# Patient Record
Sex: Male | Born: 1990 | Race: Black or African American | Hispanic: No | Marital: Married | Smoking: Current every day smoker
Health system: Southern US, Community
[De-identification: ages and names within clinical notes are randomized; demographics above are authoritative.]

## PROBLEM LIST (undated history)

## (undated) DIAGNOSIS — L0291 Cutaneous abscess, unspecified: Secondary | ICD-10-CM

## (undated) DIAGNOSIS — J40 Bronchitis, not specified as acute or chronic: Secondary | ICD-10-CM

---
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
## 2010-09-27 ENCOUNTER — Emergency Department (HOSPITAL_BASED_OUTPATIENT_CLINIC_OR_DEPARTMENT_OTHER)
Admission: EM | Admit: 2010-09-27 | Discharge: 2010-09-27 | Disposition: A | Discharge status: Home / Self Care | Payer: Self-pay | Attending: Emergency Medicine | Admitting: Emergency Medicine

## 2010-09-27 DIAGNOSIS — B009 Herpesviral infection, unspecified: Secondary | ICD-10-CM | POA: Insufficient documentation

## 2010-09-27 DIAGNOSIS — F17200 Nicotine dependence, unspecified, uncomplicated: Secondary | ICD-10-CM | POA: Insufficient documentation

## 2010-12-17 ENCOUNTER — Emergency Department (HOSPITAL_BASED_OUTPATIENT_CLINIC_OR_DEPARTMENT_OTHER)
Admission: EM | Admit: 2010-12-17 | Discharge: 2010-12-18 | Disposition: A | Discharge status: Home / Self Care | Payer: Self-pay | Attending: Emergency Medicine | Admitting: Emergency Medicine

## 2010-12-17 DIAGNOSIS — L03317 Cellulitis of buttock: Secondary | ICD-10-CM | POA: Insufficient documentation

## 2010-12-17 DIAGNOSIS — L0231 Cutaneous abscess of buttock: Secondary | ICD-10-CM | POA: Insufficient documentation

## 2011-10-12 ENCOUNTER — Emergency Department (HOSPITAL_BASED_OUTPATIENT_CLINIC_OR_DEPARTMENT_OTHER)
Admission: EM | Admit: 2011-10-12 | Discharge: 2011-10-12 | Disposition: A | Discharge status: Home / Self Care | Payer: Self-pay | Attending: Emergency Medicine | Admitting: Emergency Medicine

## 2011-10-12 ENCOUNTER — Encounter (HOSPITAL_BASED_OUTPATIENT_CLINIC_OR_DEPARTMENT_OTHER): Payer: Self-pay | Admitting: Emergency Medicine

## 2011-10-12 DIAGNOSIS — L0231 Cutaneous abscess of buttock: Secondary | ICD-10-CM

## 2011-10-12 HISTORY — DX: Cutaneous abscess, unspecified: L02.91

## 2011-10-12 MED ORDER — OXYCODONE-ACETAMINOPHEN 5-325 MG PO TABS: ORAL_TABLET | ORAL | Status: AC

## 2011-10-12 MED ORDER — LIDOCAINE HCL 2 % IJ SOLN
INTRAMUSCULAR | Status: AC
  Administered 2011-10-12: 16:00:00
  Filled 2011-10-12: qty 1

## 2011-10-12 MED ORDER — CLINDAMYCIN HCL 300 MG PO CAPS: 300.0000 mg | ORAL_CAPSULE | Freq: Three times a day (TID) | ORAL | Status: AC

## 2011-10-12 MED ORDER — SULFAMETHOXAZOLE-TRIMETHOPRIM 800-160 MG PO TABS: 1.0000 | ORAL_TABLET | Freq: Two times a day (BID) | ORAL | Status: AC

## 2011-10-12 NOTE — ED Notes (Signed)
Pt c/o recurrent abscess on LT buttock

## 2011-10-12 NOTE — Discharge Instructions (Signed)
Abscess An abscess (boil or furuncle) is an infected area that contains a collection of pus.  SYMPTOMS Signs and symptoms of an abscess include pain, tenderness, redness, or hardness. You may feel a moveable soft area under your skin. An abscess can occur anywhere in the body.  TREATMENT  A surgical cut (incision) may be made over your abscess to drain the pus. Gauze may be packed into the space or a drain may be looped through the abscess cavity (pocket). This provides a drain that will allow the cavity to heal from the inside outwards. The abscess may be painful for a few days, but should feel much better if it was drained.  Your abscess, if seen early, may not have localized and may not have been drained. If not, another appointment may be required if it does not get better on its own or with medications. HOME CARE INSTRUCTIONS   Only take over-the-counter or prescription medicines for pain, discomfort, or fever as directed by your caregiver.   Take your antibiotics as directed if they were prescribed. Finish them even if you start to feel better.   Keep the skin and clothes clean around your abscess.   If the abscess was drained, you will need to use gauze dressing to collect any draining pus. Dressings will typically need to be changed 3 or more times a day.   The infection may spread by skin contact with others. Avoid skin contact as much as possible.   Practice good hygiene. This includes regular hand washing, cover any draining skin lesions, and do not share personal care items.   If you participate in sports, do not share athletic equipment, towels, whirlpools, or personal care items. Shower after every practice or tournament.   If a draining area cannot be adequately covered:   Do not participate in sports.   Children should not participate in day care until the wound has healed or drainage stops.   If your caregiver has given you a follow-up appointment, it is very important  to keep that appointment. Not keeping the appointment could result in a much worse infection, chronic or permanent injury, pain, and disability. If there is any problem keeping the appointment, you must call back to this facility for assistance.  SEEK MEDICAL CARE IF:   You develop increased pain, swelling, redness, drainage, or bleeding in the wound site.   You develop signs of generalized infection including muscle aches, chills, fever, or a general ill feeling.   You have an oral temperature above 102 F (38.9 C).  MAKE SURE YOU:   Understand these instructions.   Will watch your condition.   Will get help right away if you are not doing well or get worse.  Document Released: 03/10/2005 Document Revised: 05/20/2011 Document Reviewed: 01/02/2008 [employer] Patient Information 2012 [employer].      Narcotic and benzodiazepine use may cause drowsiness, slowed breathing or dependence.  Please use with caution and do not drive, operate machinery or watch young children alone while taking them.  Taking combinations of these medications or drinking alcohol will potentiate these effects.    

## 2011-10-12 NOTE — ED Provider Notes (Signed)
History     CSN: [id number]  Arrival date & time 10/12/11  1449   First MD Initiated Contact with Patient 10/12/11 1517      Chief Complaint  Patient presents with  . Recurrent Skin Infections    (Consider location/radiation/quality/duration/timing/severity/associated sxs/prior treatment) HPI Comments: Pt reports prior h/o multiple abscesses.  He reports 2 lesions to left buttock, one came up a few days ago, one just yesterday.  No fevers.  He tried putting topical medication on it and one drained a small amount.    The history is provided by the patient.    Past Medical History  Diagnosis Date  . Abscess     History reviewed. No pertinent past surgical history.  History reviewed. No pertinent family history.  History  Substance Use Topics  . Smoking status: Current Everyday Smoker  . Smokeless tobacco: Not on file  . Alcohol Use: No      Review of Systems  Constitutional: Negative for fever and chills.  Gastrointestinal: Negative for blood in stool and rectal pain.  Musculoskeletal: Negative for myalgias.  Skin: Positive for wound.    Allergies  Hydrocodone  Home Medications   Current Outpatient Rx  Name Route Sig Dispense Refill  . CLINDAMYCIN HCL 300 MG PO CAPS Oral Take 1 capsule (300 mg total) by mouth 3 (three) times daily. 30 capsule 0  . OXYCODONE-ACETAMINOPHEN 5-325 MG PO TABS  1-2 tablets po q 6 hours prn moderate to severe pain 20 tablet 0  . SULFAMETHOXAZOLE-TRIMETHOPRIM 800-160 MG PO TABS Oral Take 1 tablet by mouth 2 (two) times daily. 28 tablet 0    BP 145/77  Pulse 87  Temp(Src) 98.5 F (36.9 C) (Oral)  Resp 16  Ht 5\' 11"  (1.803 m)  Wt 227 lb (102.967 kg)  BMI 31.66 kg/m2  SpO2 100%  Physical Exam  Nursing note and vitals reviewed. Constitutional: He appears well-developed and well-nourished.  HENT:  Head: Normocephalic.  Abdominal: Soft.  Neurological: He is alert.  Skin: Skin is warm, dry and intact. He is not diaphoretic.        ED Course  INCISION AND DRAINAGE Date/Time: 10/12/2011 3:35 PM Performed by: Lear Ng. Authorized by: Lear Ng Consent: Verbal consent obtained. Risks and benefits: risks, benefits and alternatives were discussed Consent given by: patient Patient understanding: patient states understanding of the procedure being performed Patient identity confirmed: verbally with patient Time out: Immediately prior to procedure a "time out" was called to verify the correct patient, procedure, equipment, support staff and site/side marked as required. Type: abscess Body area: lower extremity Location details: left buttock Anesthesia: local infiltration Local anesthetic: lidocaine 2% without epinephrine Anesthetic total: 1 ml Patient sedated: no Scalpel size: 11 Incision type: single straight Complexity: complex Drainage: purulent and bloody Drainage amount: moderate Wound treatment: wound left open Packing material: 1/4 in iodoform gauze Patient tolerance: Patient tolerated the procedure well with no immediate complications. Comments: Induration still present underneath.    INCISION AND DRAINAGE Date/Time: 10/12/2011 4:06 PM Performed by: Lear Ng. Authorized by: Lear Ng Consent: Verbal consent obtained. Risks and benefits: risks, benefits and alternatives were discussed Consent given by: patient Patient understanding: patient states understanding of the procedure being performed Patient identity confirmed: verbally with patient Time out: Immediately prior to procedure a "time out" was called to verify the correct patient, procedure, equipment, support staff and site/side marked as required. Type: abscess Body area: lower extremity Location details: left buttock Anesthesia: local infiltration Local  anesthetic: lidocaine 2% without epinephrine Anesthetic total: 1 ml Patient sedated: no Scalpel size: 11 Incision type: single straight Complexity:  simple Drainage: serosanguinous Drainage amount: scant Wound treatment: wound left open Patient tolerance: Patient tolerated the procedure well with no immediate complications.   (including critical care time)  Labs Reviewed - No data to display No results found.   1. Abscess of buttock, left   2. Abscess of left buttock       MDM  2 abscesses, requiring I&D.  Pt drove self.  Will give Rx for pain and abx to cover for MRSA as well.  Can return for wound recheck in 2 days as here or with PCP due to continued induration for superior abscess and no sig pus drainage from second one.        Gavin Pound. Mariadelcarmen Corella, MD 10/12/11 1609

## 2011-10-12 NOTE — ED Notes (Signed)
Wound care provided by Marijean Niemann, EMT

## 2011-11-30 ENCOUNTER — Emergency Department (HOSPITAL_BASED_OUTPATIENT_CLINIC_OR_DEPARTMENT_OTHER)
Admission: EM | Admit: 2011-11-30 | Discharge: 2011-11-30 | Disposition: A | Discharge status: Home / Self Care | Payer: Self-pay | Attending: Emergency Medicine | Admitting: Emergency Medicine

## 2011-11-30 ENCOUNTER — Encounter (HOSPITAL_BASED_OUTPATIENT_CLINIC_OR_DEPARTMENT_OTHER): Payer: Self-pay | Admitting: *Deleted

## 2011-11-30 DIAGNOSIS — J40 Bronchitis, not specified as acute or chronic: Secondary | ICD-10-CM | POA: Insufficient documentation

## 2011-11-30 DIAGNOSIS — F17200 Nicotine dependence, unspecified, uncomplicated: Secondary | ICD-10-CM | POA: Insufficient documentation

## 2011-11-30 MED ORDER — AZITHROMYCIN 250 MG PO TABS: 500.0000 mg | ORAL_TABLET | Freq: Once | ORAL | Status: DC

## 2011-11-30 MED ORDER — AZITHROMYCIN 250 MG PO TABS: 250.0000 mg | ORAL_TABLET | Freq: Every day | ORAL | Status: AC

## 2011-11-30 NOTE — Discharge Instructions (Signed)
Bronchitis Bronchitis is a problem of the air tubes leading to your lungs. This problem makes it hard for air to get in and out of the lungs. You may cough a lot because your air tubes are narrow. Going without care can cause lasting (chronic) bronchitis. HOME CARE   Drink enough fluids to keep your pee (urine) clear or pale yellow.   Use a cool mist humidifier.   Quit smoking if you smoke. If you keep smoking, the bronchitis might not get better.   Only take medicine as told by your doctor.  GET HELP RIGHT AWAY IF:   Coughing keeps you awake.   You start to wheeze.   You become more sick or weak.   You have a hard time breathing or get short of breath.   You cough up blood.   Coughing lasts more than 2 weeks.   You have a fever.   Your baby is older than 3 months with a rectal temperature of 102 F (38.9 C) or higher.   Your baby is 3 months old or younger with a rectal temperature of 100.4 F (38 C) or higher.  MAKE SURE YOU:  Understand these instructions.   Will watch your condition.   Will get help right away if you are not doing well or get worse.  Document Released: 11/17/2007 Document Revised: 05/20/2011 Document Reviewed: 05/02/2009 [employer] Patient Information 2012 [employer]. 

## 2011-11-30 NOTE — ED Provider Notes (Signed)
History     CSN: [id number]  Arrival date & time 11/30/11  1353   First MD Initiated Contact with Patient 11/30/11 1429      Chief Complaint  Patient presents with  . URI    (Consider location/radiation/quality/duration/timing/severity/associated sxs/prior treatment) HPI 24 hours of cough, with productive sputum.  Hx of smoking.  No wheezing.  Denies fever, chill, n/v.   Past Medical History  Diagnosis Date  . Abscess     History reviewed. No pertinent past surgical history.  History reviewed. No pertinent family history.  History  Substance Use Topics  . Smoking status: Current Everyday Smoker  . Smokeless tobacco: Not on file  . Alcohol Use: No      Review of Systems  All other systems reviewed and are negative.    Allergies  Hydrocodone  Home Medications   Current Outpatient Rx  Name Route Sig Dispense Refill  . AZITHROMYCIN 250 MG PO TABS Oral Take 1 tablet (250 mg total) by mouth daily. Take first 2 tablets together, then 1 every day until finished. 6 tablet 0    BP 118/89  Pulse 72  Temp 98.1 F (36.7 C) (Oral)  Resp 18  Ht 5\' 11"  (1.803 m)  Wt 215 lb (97.523 kg)  BMI 29.99 kg/m2  SpO2 100%  Physical Exam  Nursing note and vitals reviewed. Constitutional: He is oriented to person, place, and time. He appears well-developed and well-nourished. No distress.  HENT:  Head: Normocephalic and atraumatic.  Eyes: Pupils are equal, round, and reactive to light.  Neck: Normal range of motion.  Cardiovascular: Normal rate and intact distal pulses.   Pulmonary/Chest: Effort normal. No respiratory distress. He has no wheezes.  Abdominal: Normal appearance. He exhibits no distension. There is no tenderness. There is no rebound.  Musculoskeletal: Normal range of motion.  Neurological: He is alert and oriented to person, place, and time. No cranial nerve deficit.  Skin: Skin is warm and dry. No rash noted.  Psychiatric: He has a normal mood and affect.  His behavior is normal.    ED Course  Procedures (including critical care time)  Labs Reviewed - No data to display No results found.   1. Bronchitis       MDM         Nelia Shi, MD 11/30/11 [id number]

## 2011-11-30 NOTE — ED Notes (Signed)
Pt c/o of cough cold type symptoms since last nite. Pt in NAD. States has a 23 year old daughter and wants to make sure he wont make her sick"

## 2011-11-30 NOTE — ED Notes (Signed)
Pt c/o uri symptoms x 2 days 

## 2011-12-16 ENCOUNTER — Encounter (HOSPITAL_BASED_OUTPATIENT_CLINIC_OR_DEPARTMENT_OTHER): Payer: Self-pay | Admitting: *Deleted

## 2011-12-16 ENCOUNTER — Emergency Department (HOSPITAL_BASED_OUTPATIENT_CLINIC_OR_DEPARTMENT_OTHER): Payer: Self-pay

## 2011-12-16 ENCOUNTER — Emergency Department (HOSPITAL_BASED_OUTPATIENT_CLINIC_OR_DEPARTMENT_OTHER)
Admission: EM | Admit: 2011-12-16 | Discharge: 2011-12-16 | Disposition: A | Discharge status: Home / Self Care | Payer: Self-pay | Attending: Emergency Medicine | Admitting: Emergency Medicine

## 2011-12-16 DIAGNOSIS — X500XXA Overexertion from strenuous movement or load, initial encounter: Secondary | ICD-10-CM | POA: Insufficient documentation

## 2011-12-16 DIAGNOSIS — S92109A Unspecified fracture of unspecified talus, initial encounter for closed fracture: Secondary | ICD-10-CM | POA: Insufficient documentation

## 2011-12-16 HISTORY — DX: Bronchitis, not specified as acute or chronic: J40

## 2011-12-16 MED ORDER — OXYCODONE-ACETAMINOPHEN 5-325 MG PO TABS: 1.0000 | ORAL_TABLET | ORAL | Status: AC | PRN

## 2011-12-16 MED ORDER — IBUPROFEN 800 MG PO TABS
800.0000 mg | ORAL_TABLET | Freq: Once | ORAL | Status: AC
  Administered 2011-12-16: 800 mg via ORAL
  Filled 2011-12-16: qty 1

## 2011-12-16 NOTE — ED Provider Notes (Signed)
History     CSN: [id number]  Arrival date & time 12/16/11  1100   First MD Initiated Contact with Patient 12/16/11 1143      Chief Complaint  Patient presents with  . Ankle Injury     Patient is a 21 y.o. male presenting with lower extremity injury. The history is provided by the patient.  Ankle Injury This is a new problem. The current episode started yesterday. The problem occurs constantly. The problem has been gradually worsening. Pertinent negatives include no headaches. The symptoms are aggravated by walking. The symptoms are relieved by rest. He has tried rest for the symptoms. The treatment provided mild relief.  pt reports twisting his right ankle yesterday No fall reported Reports it hurts to walk   Past Medical History  Diagnosis Date  . Abscess   . Bronchitis     History reviewed. No pertinent past surgical history.  No family history on file.  History  Substance Use Topics  . Smoking status: Current Everyday Smoker  . Smokeless tobacco: Not on file  . Alcohol Use: No      Review of Systems  Musculoskeletal: Negative for back pain.  Neurological: Negative for headaches.    Allergies  Hydrocodone  Home Medications   Current Outpatient Rx  Name Route Sig Dispense Refill  . OXYCODONE-ACETAMINOPHEN 5-325 MG PO TABS Oral Take 1 tablet by mouth every 4 (four) hours as needed for pain. 15 tablet 0    BP 141/88  Pulse 86  Temp 98.9 F (37.2 C) (Oral)  Resp 20  SpO2 99%  Physical Exam CONSTITUTIONAL: Well developed/well nourished HEAD AND FACE: Normocephalic/atraumatic EYES: EOMI/PERRL ENMT: Mucous membranes moist NECK: supple no meningeal signs SPINE:entire spine nontender CV: S1/S2 noted, no murmurs/rubs/gallops noted LUNGS: Lungs are clear to auscultation bilaterally, no apparent distress ABDOMEN: soft, nontender, no rebound or guarding NEURO: Pt is awake/alert, moves all extremitiesx4 EXTREMITIES: pulses normal, full ROM.  Tenderness to  proximal aspect of right foot, but no bruising/edema noted.  No right prox fib tenderness.  No foot/ankle deformity noted SKIN: warm, color normal PSYCH: no abnormalities of mood noted  ED Course  Procedures   Labs Reviewed - No data to display Dg Ankle Complete Right  12/16/2011  *RADIOLOGY REPORT*  Clinical Data: Twisted ankle with pain laterally  RIGHT ANKLE - COMPLETE 3+ VIEW  Comparison: None.  Findings: No definite malleolar fracture is seen.  The ankle joint appears normal.  The tip of the distal right fibula is somewhat demineralized and thinned, but no definite fracture is seen. However, on the lateral view there is an avulsion fracture fragment from the talar beak on the dorsal aspect.  IMPRESSION: Avulsion fracture fragment from the talar beak on the lateral view.  Original Report Authenticated By: Juline Patch, M.D.     1. Talar fracture     Crutches/posterior splint (applied by nurse) and work restrictions given and needs close ortho f/u   MDM  Nursing notes including past medical history and social history reviewed and considered in documentation xrays reviewed and considered         Joya Gaskins, MD 12/16/11 1311

## 2011-12-16 NOTE — ED Notes (Signed)
Posterior short leg applied to right leg; Crutches given; patient able to walk with crutches prior to discharge.

## 2011-12-16 NOTE — ED Notes (Signed)
Twisted his right ankle last night. Used Ibuprofen with relief of pain, elevation and applied ice. Ace inplace on arrival to triage. Slight swelling.

## 2013-01-06 IMAGING — CR DG ANKLE COMPLETE 3+V*R*
3 series · 3 of 3 positions shown · non-contrast
Comparison: None.

CLINICAL DATA: Twisted ankle with pain laterally

RIGHT ANKLE - COMPLETE 3+ VIEW

[t ankle joint ap right]
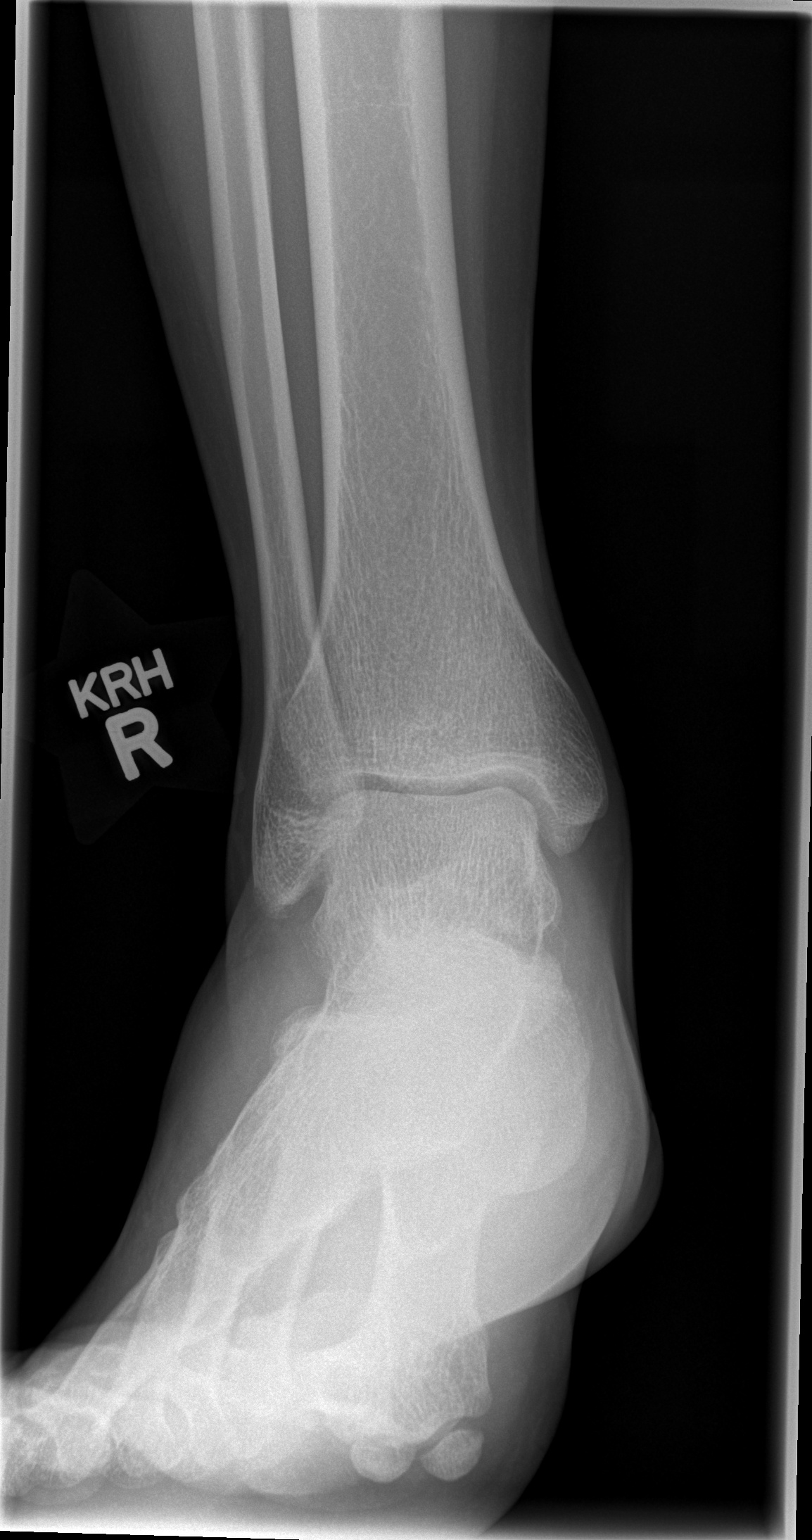

[t ankle joint oblique right]
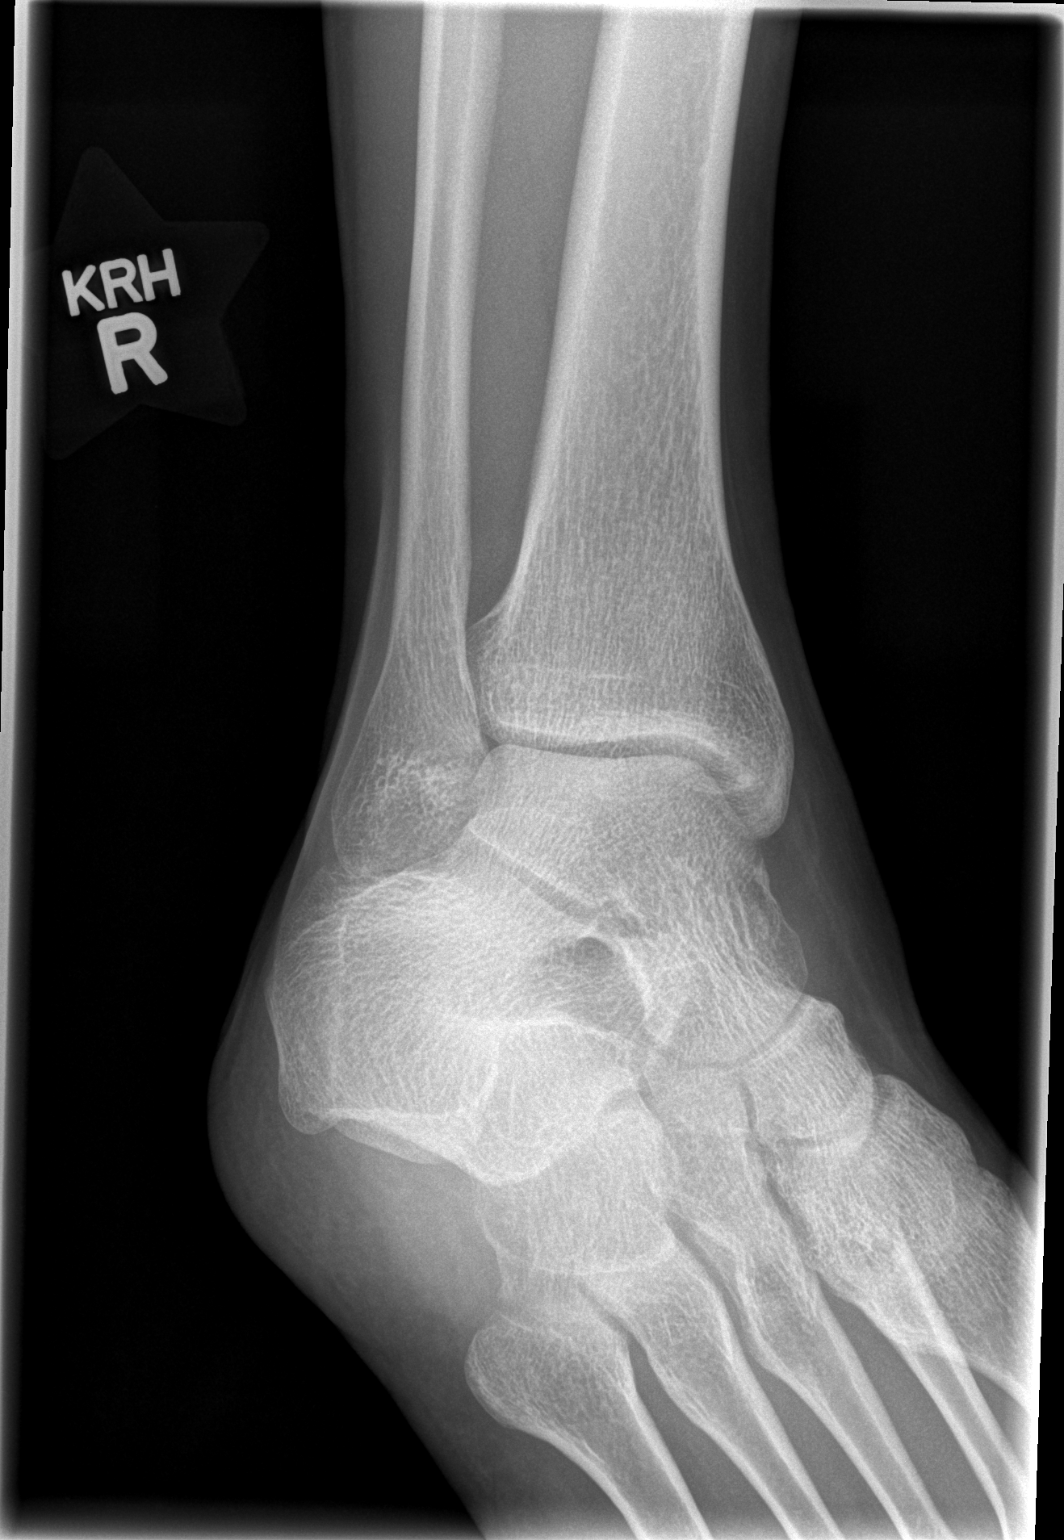

[t ankle joint lat right]
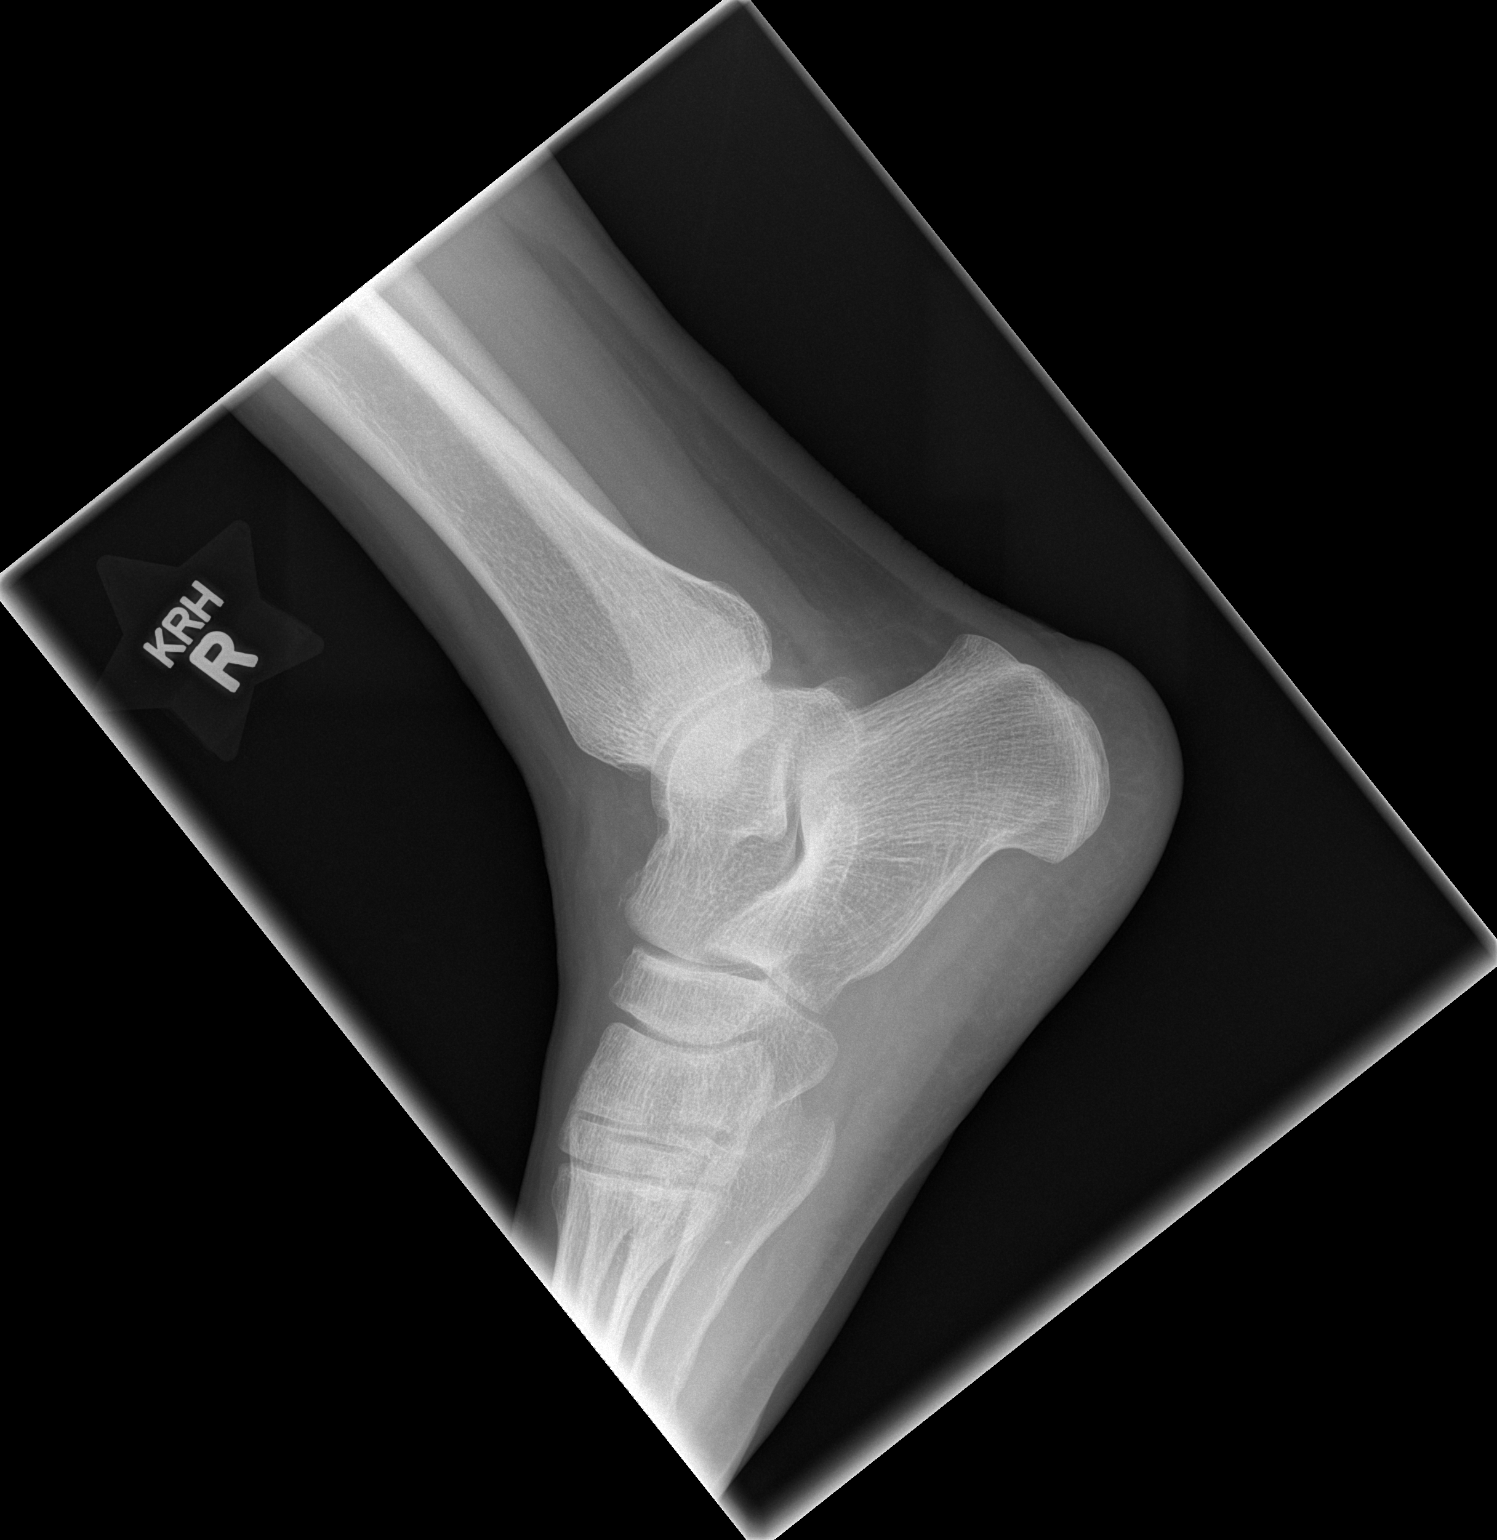

[3 of 3 positions shown; findings below may reference images not displayed]

FINDINGS: No definite malleolar fracture is seen.  The ankle joint
appears normal.  The tip of the distal right fibula is somewhat
demineralized and thinned, but no definite fracture is seen.
However, on the lateral view there is an avulsion fracture fragment
from the talar beak on the dorsal aspect.
IMPRESSION: Avulsion fracture fragment from the talar beak on the lateral view.

## 2013-09-03 ENCOUNTER — Emergency Department (HOSPITAL_BASED_OUTPATIENT_CLINIC_OR_DEPARTMENT_OTHER): Payer: Self-pay

## 2013-09-03 ENCOUNTER — Encounter (HOSPITAL_BASED_OUTPATIENT_CLINIC_OR_DEPARTMENT_OTHER): Payer: Self-pay | Admitting: Emergency Medicine

## 2013-09-03 ENCOUNTER — Emergency Department (HOSPITAL_BASED_OUTPATIENT_CLINIC_OR_DEPARTMENT_OTHER)
Admission: EM | Admit: 2013-09-03 | Discharge: 2013-09-03 | Disposition: A | Discharge status: Home / Self Care | Payer: Self-pay | Attending: Emergency Medicine | Admitting: Emergency Medicine

## 2013-09-03 DIAGNOSIS — R0982 Postnasal drip: Secondary | ICD-10-CM | POA: Insufficient documentation

## 2013-09-03 DIAGNOSIS — F17200 Nicotine dependence, unspecified, uncomplicated: Secondary | ICD-10-CM | POA: Insufficient documentation

## 2013-09-03 DIAGNOSIS — R51 Headache: Secondary | ICD-10-CM | POA: Insufficient documentation

## 2013-09-03 DIAGNOSIS — R040 Epistaxis: Secondary | ICD-10-CM | POA: Insufficient documentation

## 2013-09-03 DIAGNOSIS — Z8709 Personal history of other diseases of the respiratory system: Secondary | ICD-10-CM | POA: Insufficient documentation

## 2013-09-03 DIAGNOSIS — Z872 Personal history of diseases of the skin and subcutaneous tissue: Secondary | ICD-10-CM | POA: Insufficient documentation

## 2013-09-03 DIAGNOSIS — J069 Acute upper respiratory infection, unspecified: Secondary | ICD-10-CM | POA: Insufficient documentation

## 2013-09-03 DIAGNOSIS — Z792 Long term (current) use of antibiotics: Secondary | ICD-10-CM | POA: Insufficient documentation

## 2013-09-03 MED ORDER — ALBUTEROL SULFATE (2.5 MG/3ML) 0.083% IN NEBU
2.5000 mg | INHALATION_SOLUTION | Freq: Once | RESPIRATORY_TRACT | Status: AC
  Administered 2013-09-03: 2.5 mg via RESPIRATORY_TRACT
  Filled 2013-09-03: qty 3

## 2013-09-03 MED ORDER — AMOXICILLIN 500 MG PO CAPS: 500.0000 mg | ORAL_CAPSULE | Freq: Three times a day (TID) | ORAL | Status: AC

## 2013-09-03 MED ORDER — ALBUTEROL SULFATE HFA 108 (90 BASE) MCG/ACT IN AERS
2.0000 | INHALATION_SPRAY | RESPIRATORY_TRACT | Status: DC | PRN
  Administered 2013-09-03: 2 via RESPIRATORY_TRACT
  Filled 2013-09-03 (×2): qty 6.7

## 2013-09-03 NOTE — Discharge Instructions (Signed)

## 2013-09-03 NOTE — ED Provider Notes (Signed)
CSN: [id number]     Arrival date & time 09/03/13  0904 History   First MD Initiated Contact with Patient 09/03/13 [id number]     Chief Complaint  Patient presents with  . Cough   Patient is a 23 y.o. male presenting with cough.  Cough Associated symptoms: headaches, sore throat and wheezing   Associated symptoms: no chest pain, no chills, no ear pain, no fever, no rhinorrhea and no shortness of breath    Greg Higgins is 23 y.o. male presented to the ED with a five-day history of productive cough, congestion and mild sore throat. Patient denies fever, irritated eyes, facial pressure, chills, nausea, vomiting or abdominal pain. Patient fatigue, headache, sore throat with cough only. Patient has been taking Advil cold and sinus with no relief. Patient does not recall a fever, he had had a few episodes of breaking out in sweats over the last couple of nights. He reports his cough is productive and with yellow sputum. Patient reports bloody nose x2. He is eating and drinking well. No sick contacts at home. He has a history of frequent bronchitis. He is a smoker.   Past Medical History  Diagnosis Date  . Abscess   . Bronchitis    History reviewed. No pertinent past surgical history. No family history on file. History  Substance Use Topics  . Smoking status: Current Every Day Smoker -- 0.50 packs/day    Types: Cigarettes  . Smokeless tobacco: Not on file  . Alcohol Use: No    Review of Systems  Constitutional: Positive for activity change and fatigue. Negative for fever, chills and appetite change.  HENT: Positive for congestion, nosebleeds, postnasal drip and sore throat. Negative for ear pain, facial swelling, hearing loss, rhinorrhea, sinus pressure, sneezing and trouble swallowing.   Eyes: Negative for pain, redness and itching.  Respiratory: Positive for cough and wheezing. Negative for shortness of breath.   Cardiovascular: Negative for chest pain and palpitations.  Gastrointestinal:  Negative for nausea, vomiting, abdominal pain, diarrhea and constipation.  Neurological: Positive for headaches.   Allergies  Hydrocodone  Home Medications   Current Outpatient Rx  Name  Route  Sig  Dispense  Refill  . amoxicillin (AMOXIL) 500 MG capsule   Oral   Take 1 capsule (500 mg total) by mouth 3 (three) times daily.   21 capsule   0    BP 146/83  Pulse 85  Temp(Src) 98.4 F (36.9 C) (Oral)  Resp 18  Ht 6\' 5"  (1.956 m)  Wt 220 lb (99.791 kg)  BMI 26.08 kg/m2  SpO2 100% Physical Exam Gen: NAD. Nontoxic in appearance. HEENT: AT. [place]. Bilateral TM visualized and normal in appearance. Bilateral eyes without injections or icterus. MMM. Bilateral nares with erythema and dried blood, no swelling. Throat without erythema or exudates.  CV: RRR . No murmur, clicks, gallops or rubs Chest: CTAB, no wheeze or crackles. Mild decrease in air movement. Normal work of breath Abd: Soft. . NTND. BS present. No Masses palpated.  Ext: No erythema. No edema.  Skin: No rashes, purpura or petechiae.  Neuro:  Normal gait. PERLA. EOMi. Alert. Grossly intact.  Psych: Normal affect, dress, mood and demeanor. Normal speech.  ED Course  Procedures (including critical care time) Labs Review Labs Reviewed - No data to display Imaging Review Dg Chest 2 View  09/03/2013   CLINICAL DATA:  Cough, congestion and wheezing.  EXAM: CHEST  2 VIEW  COMPARISON:  09/29/2005.  FINDINGS: The cardiac silhouette, mediastinal and  hilar contours are within normal limits and stable. Low lung volumes with mild vascular crowding and streaky atelectasis. No definite infiltrates and no pleural effusion.  IMPRESSION: Low lung volumes with vascular crowding and areas of streaky atelectasis in the right lung. No definite infiltrates or effusions.   Electronically Signed   By: Loralie Champagne M.D.   On: 09/03/2013 09:33     EKG Interpretation None      MDM   Final diagnoses:  Upper respiratory infection   Patient  presented to the ED with signs and symptoms of upper respiratory tract infection for 5 days, with productive cough. Patient had reported no relief with Advil cold and sinus increasing fatigue. Chest x-ray is with mild streaky atelectasis in the right lung, no infiltrates. Patient was discharged on amoxicillin for upper respiratory infection. Advised to seek care with a primary care physician. I discussed when to come to the ED, symptoms do not improve.    Natalia Leatherwood, DO 09/03/13 1454

## 2013-09-03 NOTE — ED Notes (Signed)
Pt complains of cough that started around Friday. Thought it was allergies so took allergy meds but not improving. Pt complains of dry nonproductive cough.  Denies fevers.  Pt complains of feel as though he is wheezing.  Denies sore throat.

## 2013-09-04 NOTE — ED Provider Notes (Signed)
I saw and evaluated the patient, reviewed the resident's note and I agree with the findings and plan. Patient is a 23 year old male with no significant past medical history. Presents today with complaints of a five-day history of productive cough, chest congestion and sore throat. He denies difficulty breathing or fever. He denies any ill contacts.  On exam, vitals are stable and the patient is afebrile. There is no hypoxia. Head is atraumatic, normocephalic. Neck is supple without adenopathy. Heart is regular rate and rhythm and lungs are clear. There is some maxillofacial tenderness to palpation. Oropharynx is clear.  Patient presents with URI like symptoms that are likely viral in nature. However do to the productive nature of the cough and tenderness over the sinuses, he will be prescribed amoxicillin which he is to fill if not improving in the next 2-3 days. In the meantime he is to take over-the-counter medications.     Geoffery Lyonsouglas Latricia Cerrito, MD 09/04/13 [phone]

## 2014-09-25 IMAGING — CR DG CHEST 2V
2 series · 2 of 2 positions shown · non-contrast
Comparison: 09/29/2005.

CLINICAL DATA: Cough, congestion and wheezing.

EXAM:
CHEST  2 VIEW

[w chest pa]
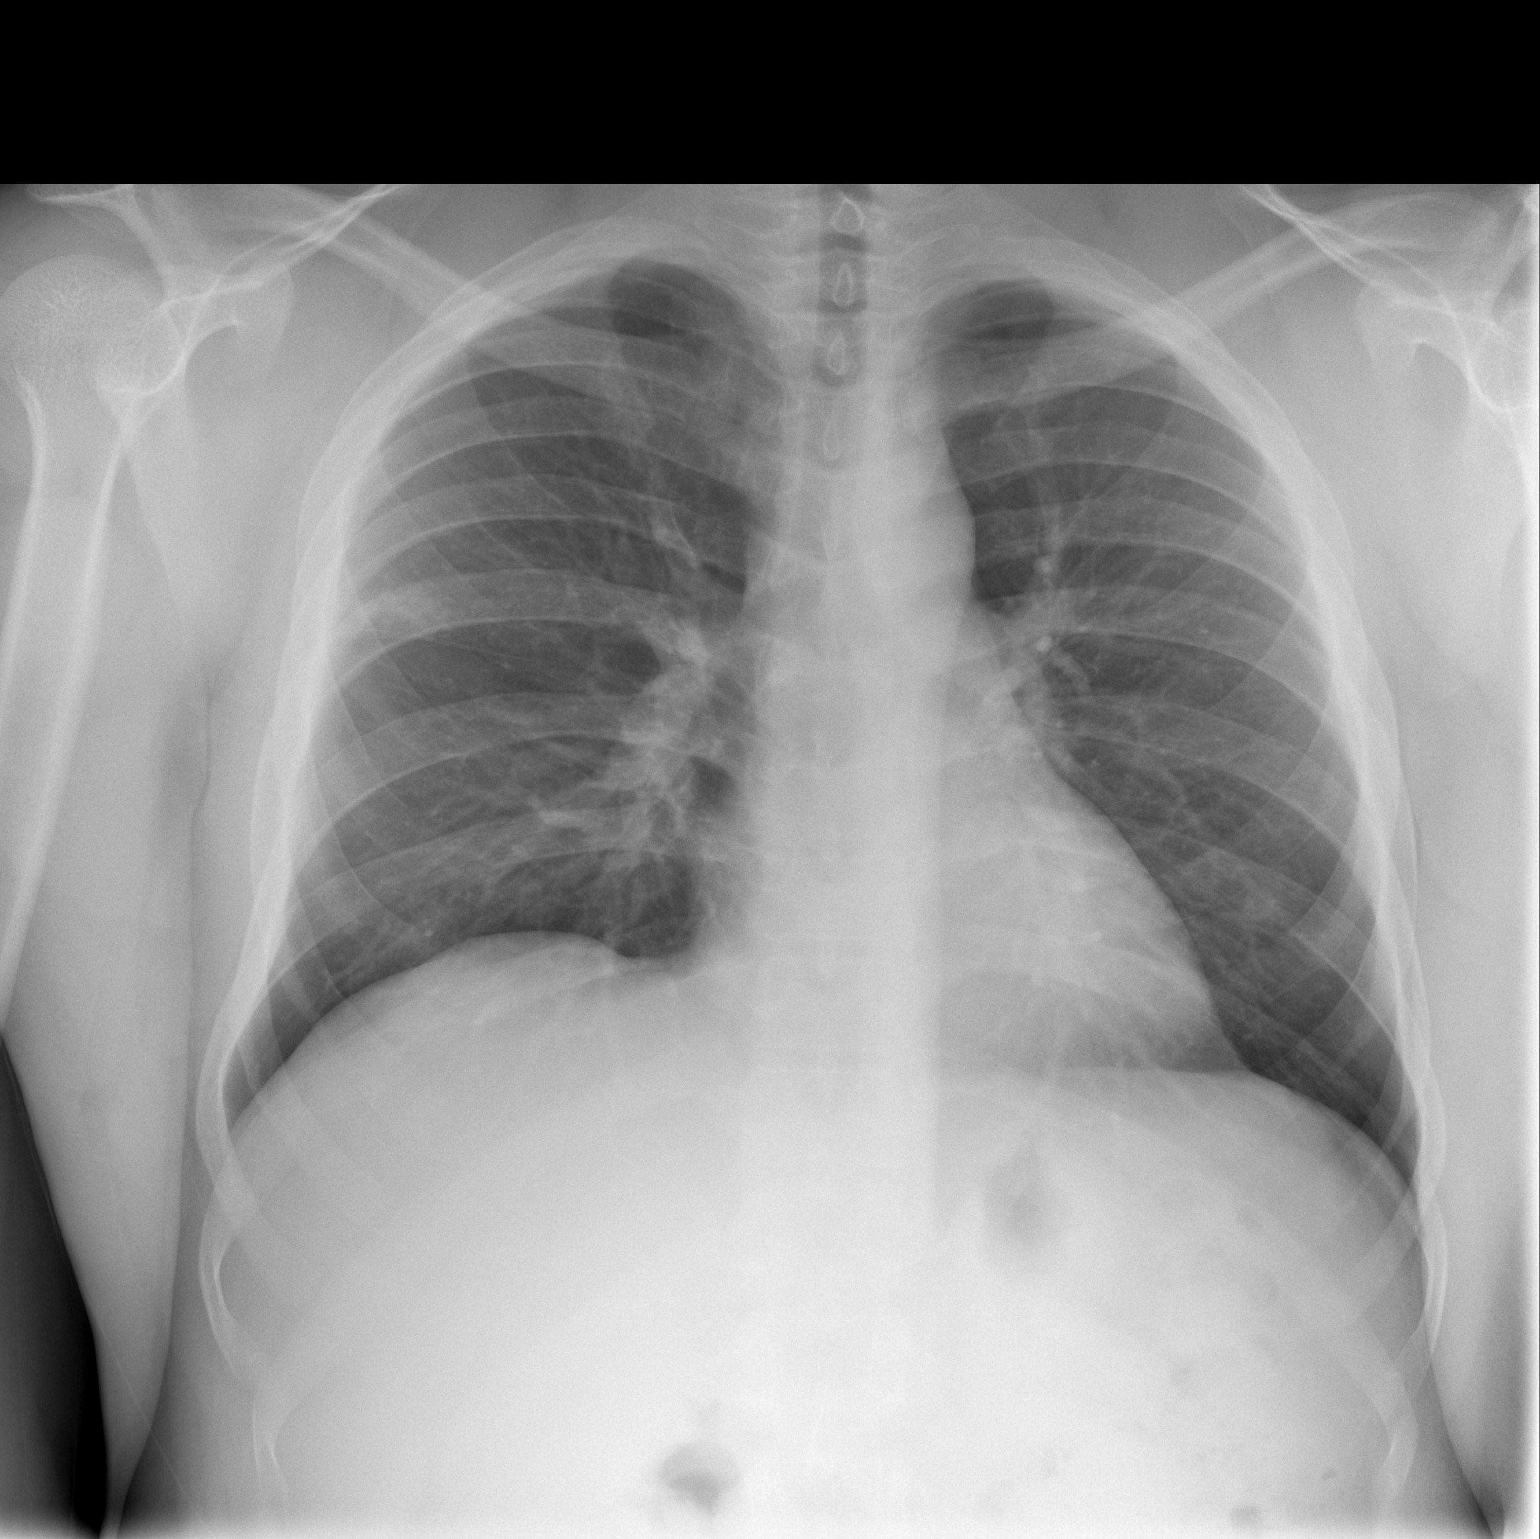

[w chest lat]
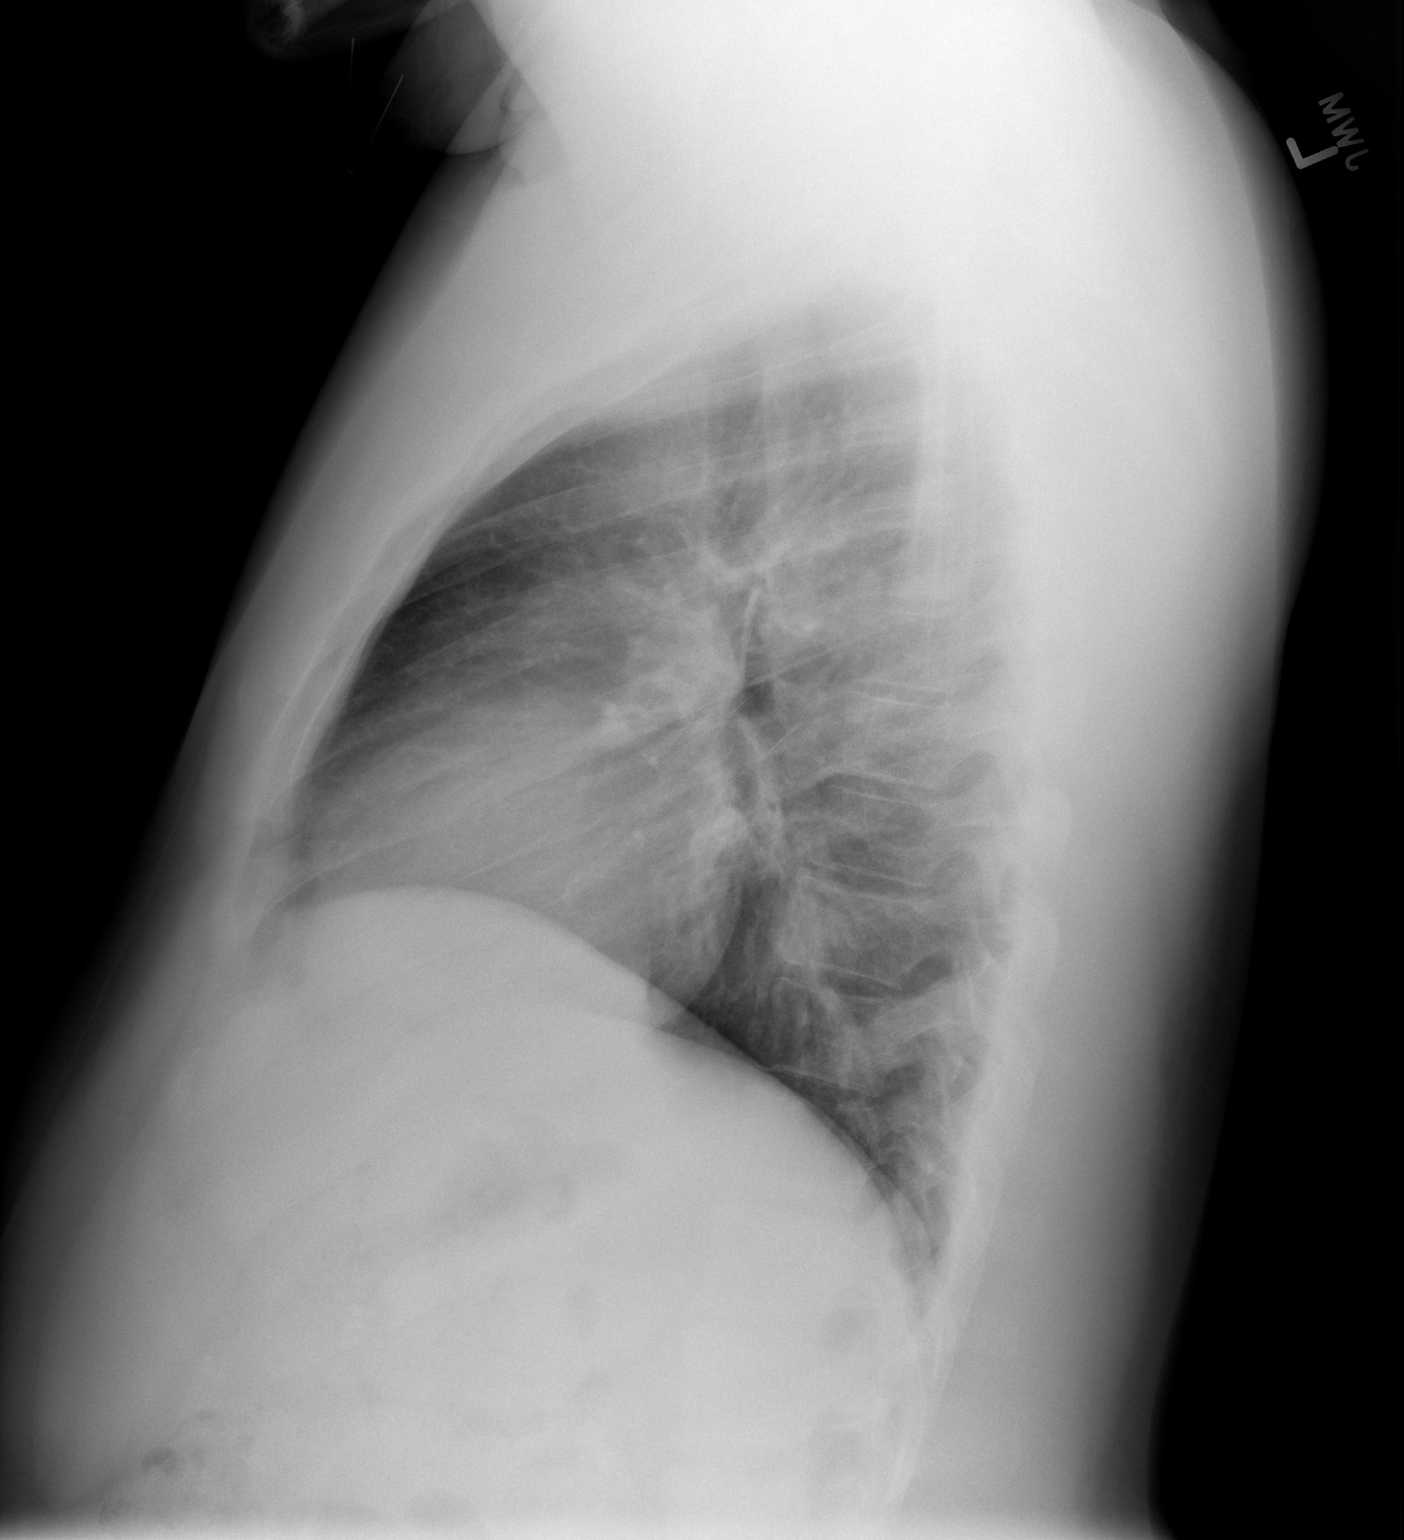

[2 of 2 positions shown; findings below may reference images not displayed]

FINDINGS: The cardiac silhouette, mediastinal and hilar contours are within
normal limits and stable. Low lung volumes with mild vascular
crowding and streaky atelectasis. No definite infiltrates and no
pleural effusion.
IMPRESSION: Low lung volumes with vascular crowding and areas of streaky
atelectasis in the right lung. No definite infiltrates or effusions.
